# Patient Record
Sex: Male | Born: 1985 | Race: White | Hispanic: No | Marital: Single | Smoking: Never smoker
Health system: Southern US, Community
[De-identification: ages and names within clinical notes are randomized; demographics above are authoritative.]

---
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
## 2008-01-07 IMAGING — CR DG TIBIA/FIBULA PORT 2V*R*
4 series · 4 of 4 positions shown · non-contrast
Comparison: none

CLINICAL DATA: Fractured tibia and fibula with intramedullary rod fixation. 
 PORTABLE TIBIA/FIBULA - 4 VIEW:

[view not recorded (1 of 4)]
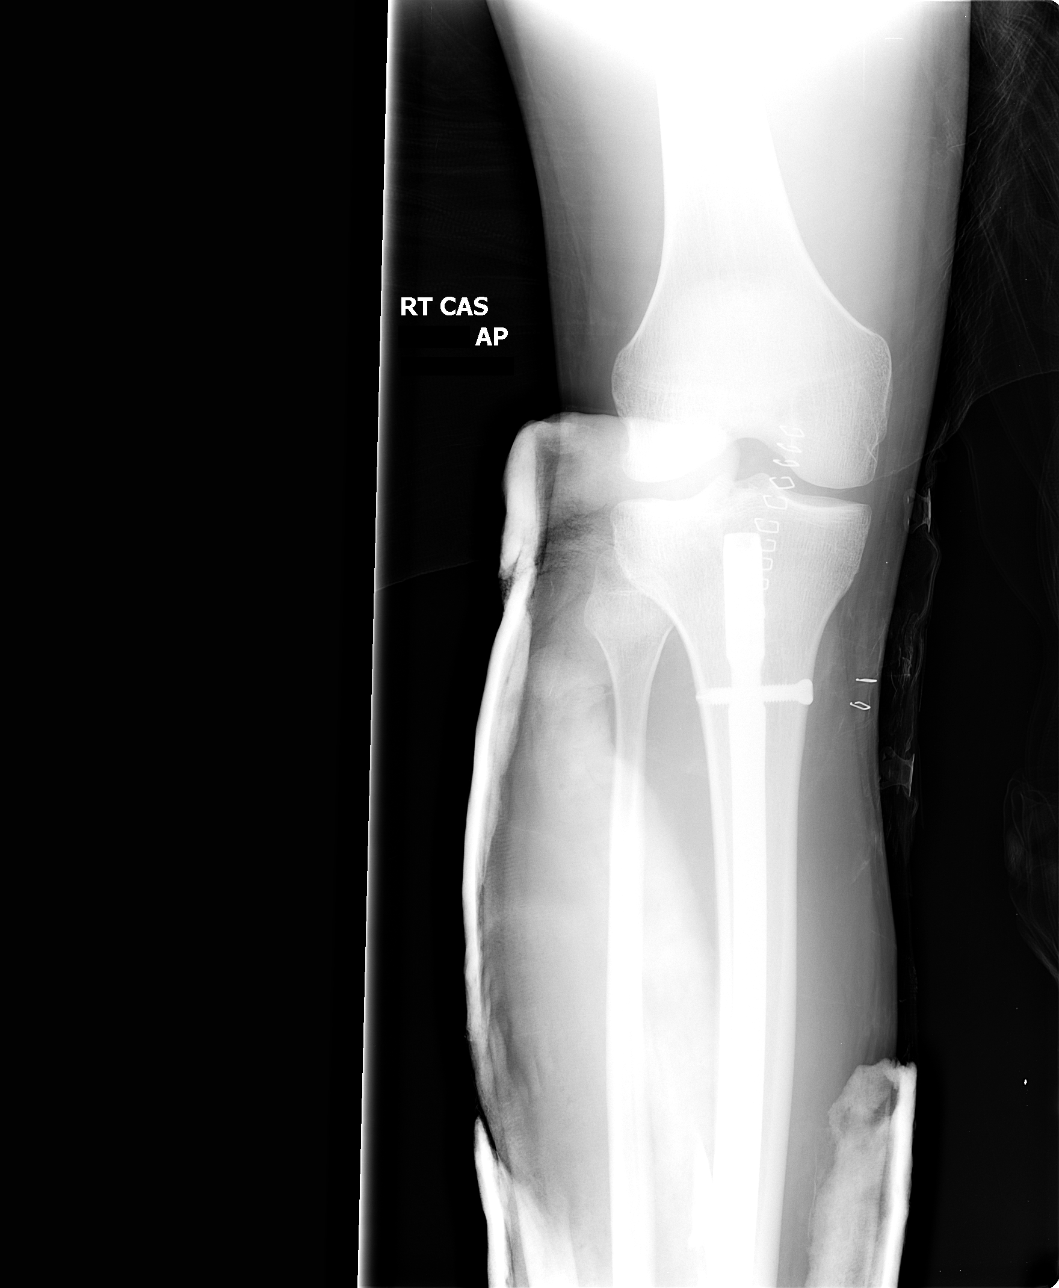

[view not recorded (2 of 4)]
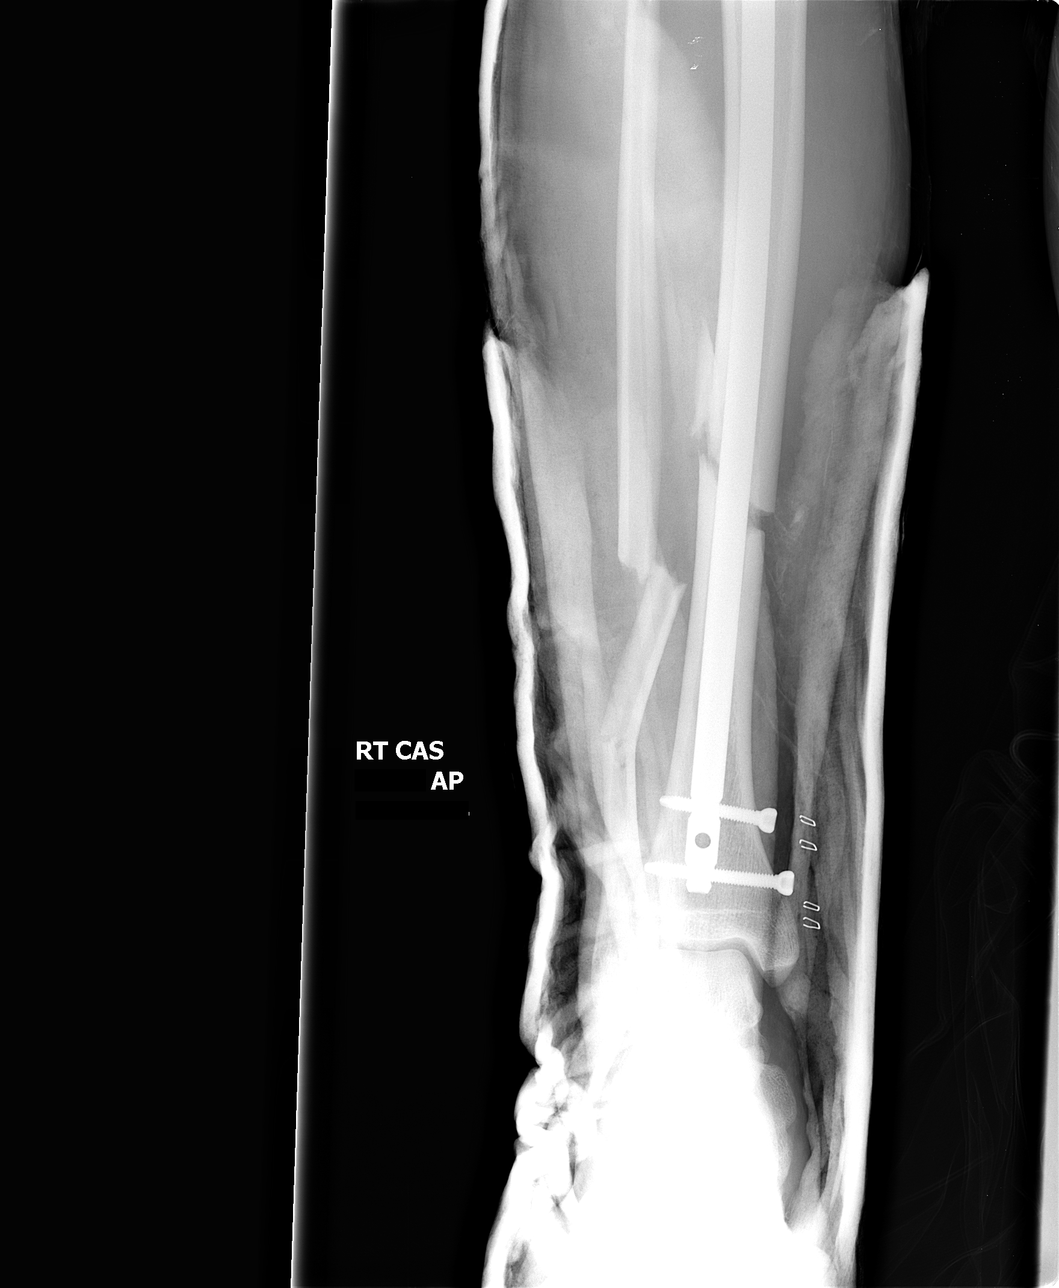

[view not recorded (3 of 4)]
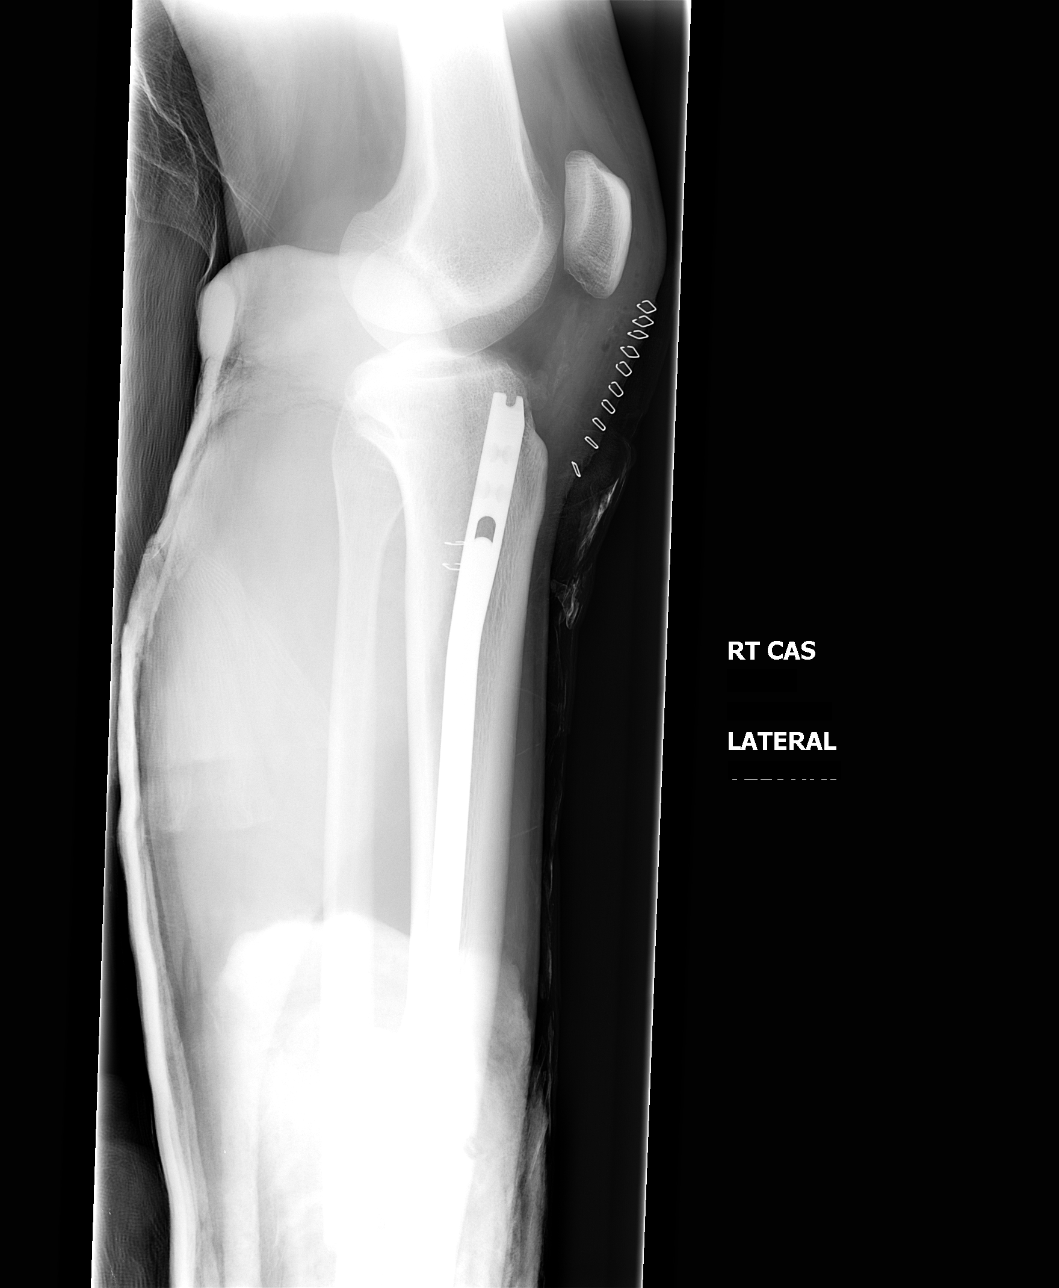

[view not recorded (4 of 4)]
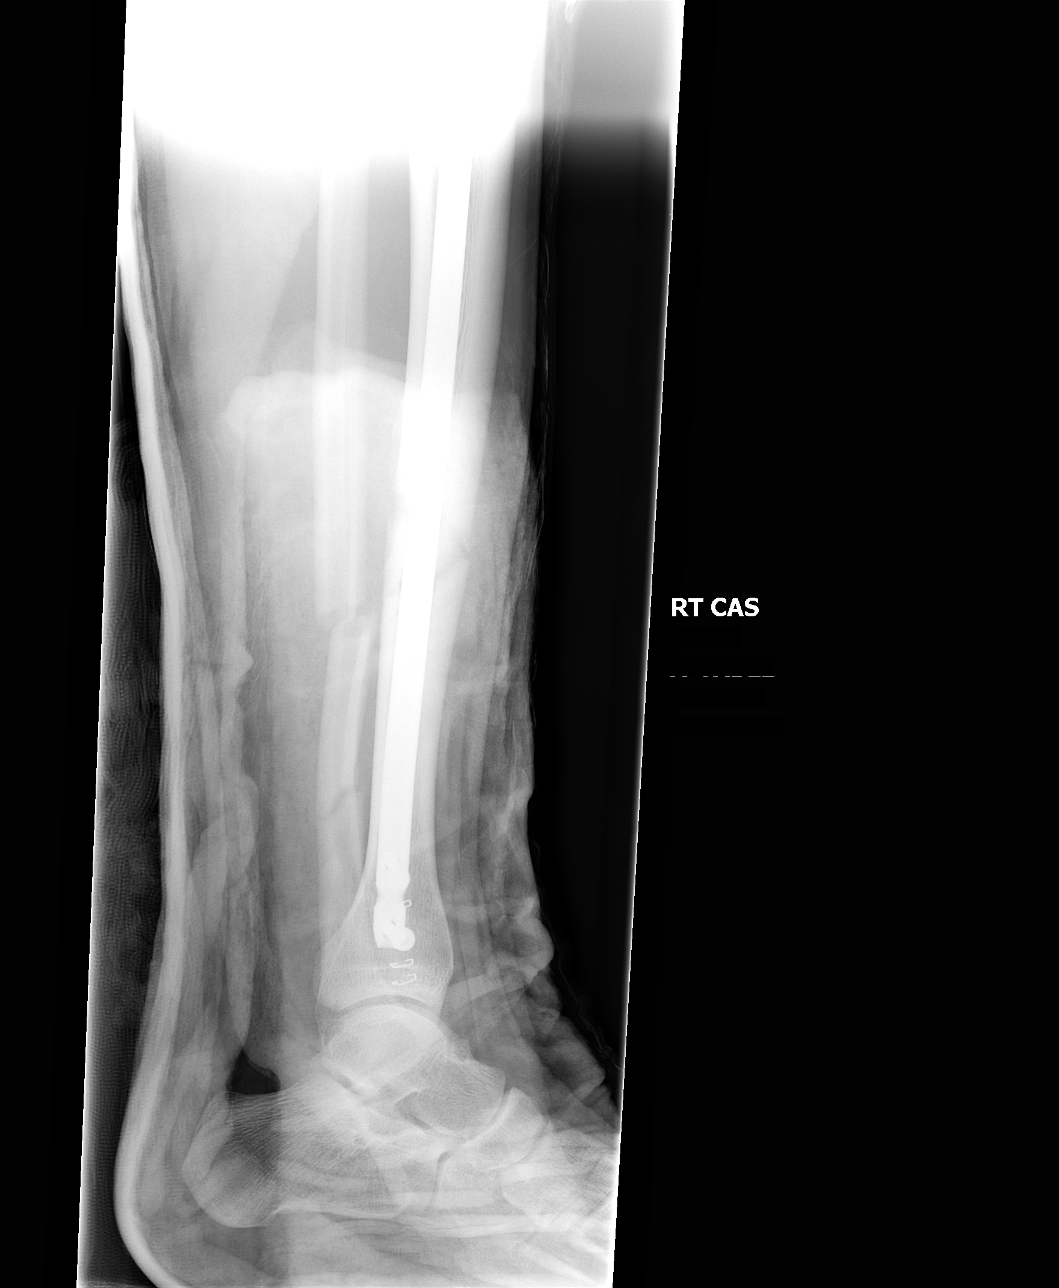

[4 of 4 positions shown; findings below may reference images not displayed]

FINDINGS: Four views of the right lower leg demonstrate intramedullary rod fixation of the tibia with a single proximal interlocking screw and two distal interlocking screws.  Redemonstration of the comminuted tibial fracture as well as the segmental fibular fracture.  The displacement of the fibula fracture has not significantly changed.  The leg has been placed within a splint.  Surgical staples along the anterior aspect of the knee.  The knee and ankle joints are intact.
IMPRESSION: Internal fixation of the comminuted tibial fracture as described.  No complicating features.
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
## 2010-11-29 LAB — CBC
MCHC: 34.3
MCV: 88.7
Platelets: 273
RDW: 12.6

## 2010-11-29 LAB — DIFFERENTIAL
Basophils Absolute: 0
Basophils Relative: 0
Eosinophils Absolute: 0
Neutro Abs: 14.5 — ABNORMAL HIGH
Neutrophils Relative %: 90 — ABNORMAL HIGH

## 2010-11-29 LAB — APTT: aPTT: 22 — ABNORMAL LOW

## 2010-11-29 LAB — PROTIME-INR: INR: 1.1

## 2018-08-01 ENCOUNTER — Ambulatory Visit (INDEPENDENT_AMBULATORY_CARE_PROVIDER_SITE_OTHER): Payer: Self-pay | Admitting: Family Medicine

## 2018-08-01 ENCOUNTER — Other Ambulatory Visit: Payer: Self-pay

## 2018-08-01 ENCOUNTER — Encounter: Payer: Self-pay | Admitting: Family Medicine

## 2018-08-01 DIAGNOSIS — M79675 Pain in left toe(s): Secondary | ICD-10-CM

## 2018-08-01 MED ORDER — DOXYCYCLINE HYCLATE 100 MG PO CAPS: 100.0000 mg | ORAL_CAPSULE | Freq: Two times a day (BID) | ORAL | 0 refills | Status: AC

## 2018-08-01 NOTE — Progress Notes (Signed)
   Office Visit Note   Patient: Matthew Mcconnell           Date of Birth: 03/04/1985           MRN: [id number] Visit Date: 08/01/2018 Requested by: No referring provider defined for this encounter. PCP: Patient, No Pcp Per  Subjective: Chief Complaint  Patient presents with  . Left Great Toe - Pain    Pain x approximately 1 month. 3 weeks ago, toe got red and had drainage around nailbed. Went to urgent care - was given mupirocin ointment. Using this and soaking in epsom salts. Toenail has stopped growing.    HPI: He is here with left great toe pain.  About 3 or 4 weeks ago he developed pain and redness in the great toe near the nail.  It started to drain and he went to urgent care and was given Bactroban.  Symptoms improved but did not go away completely and now is starting to get more uncomfortable again.  No fevers, he otherwise feels okay.  He has not had any drainage recently.  Incidentally his mother used to work in our clinic about 25 years ago.              ROS:   All other systems were reviewed and are negative.  Objective: Vital Signs: There were no vitals taken for this visit.  Physical Exam:  General:  Alert and oriented, in no acute distress. Pulm:  Breathing unlabored. Psy:  Normal mood, congruent affect. Skin: There is slight erythema but but no warmth. Left great toe: Minimal tenderness to palpation, no drainage expressible.  Good range of motion of the joint, no ingrown toenail.  Imaging: None today.  Assessment & Plan: 1.  Left great toe paronychia -We will treat with doxycycline by mouth.  If symptoms recur, then possible incision and drainage.  Follow-up as needed.     Procedures: No procedures performed  No notes on file     PMFS History: There are no active problems to display for this patient.  History reviewed. No pertinent past medical history.  History reviewed. No pertinent family history.  History reviewed. No pertinent surgical history.  Social History   Occupational History  . Not on file  Tobacco Use  . Smoking status: Never Smoker  . Smokeless tobacco: Never Used  Substance and Sexual Activity  . Alcohol use: Never    Frequency: Never  . Drug use: Never  . Sexual activity: Not on file

## 2018-08-08 ENCOUNTER — Telehealth: Payer: Self-pay | Admitting: Family Medicine

## 2018-08-08 NOTE — Telephone Encounter (Signed)
Patient called advised his left foot (Great Toe) is still bothering him. Patient said the toe still looks the same as last week. The number to contact patient is [phone]

## 2018-08-08 NOTE — Telephone Encounter (Signed)
The toe is looking red and angry again, with some drainage. He has 2 more days of Doxycycline left. Has been soaking the toe in epsom salts. Supposed to go on vacation in 2 weeks and would like to have the toe issue better by then. Scheduled appointment for tomorrow morning for a recheck.

## 2018-08-09 ENCOUNTER — Encounter: Payer: Self-pay | Admitting: Family Medicine

## 2018-08-09 ENCOUNTER — Other Ambulatory Visit: Payer: Self-pay

## 2018-08-09 ENCOUNTER — Ambulatory Visit (INDEPENDENT_AMBULATORY_CARE_PROVIDER_SITE_OTHER): Payer: BC Managed Care – PPO | Admitting: Family Medicine

## 2018-08-09 DIAGNOSIS — M79675 Pain in left toe(s): Secondary | ICD-10-CM | POA: Diagnosis not present

## 2018-08-09 MED ORDER — SULFAMETHOXAZOLE-TRIMETHOPRIM 800-160 MG PO TABS: 1.0000 | ORAL_TABLET | Freq: Two times a day (BID) | ORAL | 1 refills | Status: AC

## 2018-08-09 NOTE — Progress Notes (Signed)
   Office Visit Note   Patient: Matthew Mcconnell           Date of Birth: 1985-06-28           MRN: [id number] Visit Date: 08/09/2018 Requested by: No referring provider defined for this encounter. PCP: Patient, No Pcp Per  Subjective: Chief Complaint  Patient presents with  . Left Great Toe - Follow-up    "not much change"    HPI: He is here for follow-up left great toe paronychia.  Doxycycline initially seem to be helping, but now it does not seem to be getting any better.  He still gets some drainage, mostly clear yellow.  No fevers or chills.  He is going to the beach on vacation in about a week.              ROS:   All other systems were reviewed and are negative.  Objective: Vital Signs: There were no vitals taken for this visit.  Physical Exam:  General:  Alert and oriented, in no acute distress. Pulm:  Breathing unlabored. Psy:  Normal mood, congruent affect.  Left great toe: There is still some erythema and swelling of the paronychia.  It scant amount of clear yellow drainage.  No ingrown toenail.  Imaging: None today.  Assessment & Plan: 1.  Left great toe paronychia -Discussed with patient and elected to perform incision and drainage today.  We will switch to Bactrim.  Follow-up as needed.     Procedures: Left great toe paronychia incision and drainage: After sterile prep with Betadine, performed a digital block with a total of 4 cc 1% lidocaine without epinephrine.  Then using a 15 blade I made a small incision but there was no pus expressible.  Band-Aid was applied.  Follow-up as directed.    PMFS History: There are no active problems to display for this patient.  History reviewed. No pertinent past medical history.  History reviewed. No pertinent family history.  History reviewed. No pertinent surgical history. Social History   Occupational History  . Not on file  Tobacco Use  . Smoking status: Never Smoker  . Smokeless tobacco: Never Used  Substance  and Sexual Activity  . Alcohol use: Never    Frequency: Never  . Drug use: Never  . Sexual activity: Not on file

## 2018-08-14 ENCOUNTER — Telehealth: Payer: Self-pay

## 2018-08-14 MED ORDER — TRIAMCINOLONE ACETONIDE 0.5 % EX OINT: 1.0000 "application " | TOPICAL_OINTMENT | Freq: Two times a day (BID) | CUTANEOUS | 1 refills | Status: AC | PRN

## 2018-08-14 NOTE — Telephone Encounter (Signed)
I advised patient of the plan. Dr. Jess Barters first available appointment is on 08/19/18. The patient is going out of town on 08/18/18. He said the toe did look some better 2 days ago, but looks the same as it did at last Friday's visit again. He is just worried about it worsening while he is on vacation. Does he go ahead and refill the Bactrim once he finishes the first Rx? Any other advice I should give him?

## 2018-08-14 NOTE — Addendum Note (Signed)
Addended by: Hortencia Pilar on: 08/14/2018 03:29 PM   Modules accepted: Orders

## 2018-08-14 NOTE — Telephone Encounter (Signed)
Let's have him come in to see Sharol Given for another opinion.

## 2018-08-14 NOTE — Telephone Encounter (Signed)
Patient called stating that his left great toe is not getting any better and would like to know what the next option would be?  Asked patient if he wanted to make an appointment to come in to be seen, but he stated that he would prefer a call back first. Cb# is [phone].  Please advise.  Thank You.

## 2018-08-14 NOTE — Telephone Encounter (Signed)
Please advise 

## 2018-08-14 NOTE — Telephone Encounter (Signed)
At this point I think it's more of an inflammation problem than an infection problem.  I will call in a steroid ointment to apply to the area twice daily.  No need to fill the antibiotic again.

## 2018-08-14 NOTE — Telephone Encounter (Signed)
I advised the patient. He will finish this round of antibiotics and start using the triamcinolone ointment bid prn. He said if the toe is still not looking any different the middle of next week, he would like to see Dr. Sharol Given for that 2nd opinion - ok to call us to schedule this if needed.

## 2018-08-15 ENCOUNTER — Other Ambulatory Visit: Payer: Self-pay | Admitting: Family Medicine

## 2018-09-03 ENCOUNTER — Encounter: Payer: Self-pay | Admitting: Orthopedic Surgery

## 2018-09-03 ENCOUNTER — Ambulatory Visit: Payer: BC Managed Care – PPO

## 2018-09-03 ENCOUNTER — Ambulatory Visit (INDEPENDENT_AMBULATORY_CARE_PROVIDER_SITE_OTHER): Payer: BC Managed Care – PPO | Admitting: Orthopedic Surgery

## 2018-09-03 DIAGNOSIS — L03032 Cellulitis of left toe: Secondary | ICD-10-CM | POA: Diagnosis not present

## 2018-09-03 DIAGNOSIS — M79675 Pain in left toe(s): Secondary | ICD-10-CM

## 2018-09-03 NOTE — Progress Notes (Signed)
Office Visit Note   Patient: Matthew Mcconnell           Date of Birth: 1986/01/30           MRN: [id number] Visit Date: 09/03/2018              Requested by: No referring provider defined for this encounter. PCP: Patient, No Pcp Per  Chief Complaint  Patient presents with  . Left Foot - Follow-up    NP; left foot GT pain ref by Matthew Mcconnell      HPI: Patient is a 33 year old gentleman who presents with a two-month history of infected purulent paronychial infection left great toe.  Patient states he was initially seen at urgent care and was started on an antibiotic ointment.  Patient then saw Matthew. Junius Mcconnell was started on doxycycline and this was changed to Bactrim and then patient states he was placed on a steroid cream.  Patient complains of persistent pain swelling and redness over the proximal nail.  Assessment & Plan: Visit Diagnoses:  1. Great toe pain, left   2. Paronychia of great toe of left foot     Plan: The nail was removed without complications cultures were obtained he will start dressing changes tomorrow reevaluate in 1 week we will call him if the cultures are positive.  Follow-Up Instructions: Return in about 1 week (around 09/10/2018).   Ortho Exam  Patient is alert, oriented, no adenopathy, well-dressed, normal affect, normal respiratory effort. Examination patient has a good pulse he has redness and tenderness to palpation along the nail fold consistent with a paronychial infection.  After informed consent patient underwent a digital block after prepping with Betadine the nail was removed without complications a cultures was obtained from the germinal matrix fluid.  This was packed open with Xeroform and a sterile dressing was applied.  Patient tolerated this well he was placed in a postoperative shoe.  Imaging: Xr Toe Great Left  Result Date: 09/03/2018 2 view radiographs of the left great toe shows no destructive bony changes but there is one area of lucency.  No  images are attached to the encounter.  Labs: No results found for: HGBA1C, ESRSEDRATE, CRP, LABURIC, REPTSTATUS, GRAMSTAIN, CULT, LABORGA   No results found for: ALBUMIN, PREALBUMIN, LABURIC  No results found for: MG No results found for: VD25OH  No results found for: PREALBUMIN CBC EXTENDED 08/25/2006  WBC 16.1(H)  RBC 4.51  HGB 13.7  HCT 40.0  PLT 273  NEUTROABS 14.5(H)  LYMPHSABS 0.6(L)     There is no height or weight on file to calculate BMI.  Orders:  Orders Placed This Encounter  Procedures  . Wound culture  . XR Toe Great Left   No orders of the defined types were placed in this encounter.    Procedures: No procedures performed  Clinical Data: No additional findings.  ROS:  All other systems negative, except as noted in the HPI. Review of Systems  Objective: Vital Signs: There were no vitals taken for this visit.  Specialty Comments:  No specialty comments available.  PMFS History: There are no active problems to display for this patient.  History reviewed. No pertinent past medical history.  History reviewed. No pertinent family history.  History reviewed. No pertinent surgical history. Social History   Occupational History  . Not on file  Tobacco Use  . Smoking status: Never Smoker  . Smokeless tobacco: Never Used  Substance and Sexual Activity  . Alcohol use: Never  Frequency: Never  . Drug use: Never  . Sexual activity: Not on file

## 2018-09-06 LAB — WOUND CULTURE
MICRO NUMBER:: 690182
SPECIMEN QUALITY:: ADEQUATE

## 2018-09-09 ENCOUNTER — Other Ambulatory Visit: Payer: Self-pay

## 2018-09-09 ENCOUNTER — Ambulatory Visit (INDEPENDENT_AMBULATORY_CARE_PROVIDER_SITE_OTHER): Payer: BC Managed Care – PPO | Admitting: Orthopedic Surgery

## 2018-09-09 ENCOUNTER — Encounter: Payer: Self-pay | Admitting: Orthopedic Surgery

## 2018-09-09 DIAGNOSIS — L03032 Cellulitis of left toe: Secondary | ICD-10-CM

## 2018-09-09 NOTE — Progress Notes (Signed)
   Office Visit Note   Patient: Matthew Mcconnell           Date of Birth: 01-01-1986           MRN: [id number] Visit Date: 09/09/2018              Requested by: No referring provider defined for this encounter. PCP: Patient, No Pcp Per  Chief Complaint  Patient presents with  . Left Foot - Follow-up    GT nail removal 09/04/18      HPI: Patient is a 33 year old gentleman who presents follow-up status post great toenail excision for paronychial infection.  Cultures showed gram-positive cocci as well as yeast which was felt to be consistent with normal skin flora.  Patient states his foot feels much better at this time he states the swelling has resolved.  Assessment & Plan: Visit Diagnoses:  1. Paronychia of great toe of left foot     Plan: Patient will continue with a Band-Aid to protect the nailbed follow-up as needed if he develops any recurrent redness swelling cellulitis or drainage.  Follow-Up Instructions: Return if symptoms worsen or fail to improve.   Ortho Exam  Patient is alert, oriented, no adenopathy, well-dressed, normal affect, normal respiratory effort. Examination the redness and swelling of the great toe of completely resolved.  There is no tenderness to palpation no drainage no signs of infection.  Imaging: No results found. No images are attached to the encounter.  Labs: No results found for: HGBA1C, ESRSEDRATE, CRP, LABURIC, REPTSTATUS, GRAMSTAIN, CULT, LABORGA   No results found for: ALBUMIN, PREALBUMIN, LABURIC  No results found for: MG No results found for: VD25OH  No results found for: PREALBUMIN CBC EXTENDED 08/25/2006  WBC 16.1(H)  RBC 4.51  HGB 13.7  HCT 40.0  PLT 273  NEUTROABS 14.5(H)  LYMPHSABS 0.6(L)     There is no height or weight on file to calculate BMI.  Orders:  No orders of the defined types were placed in this encounter.  No orders of the defined types were placed in this encounter.    Procedures: No procedures  performed  Clinical Data: No additional findings.  ROS:  All other systems negative, except as noted in the HPI. Review of Systems  Objective: Vital Signs: There were no vitals taken for this visit.  Specialty Comments:  No specialty comments available.  PMFS History: There are no active problems to display for this patient.  History reviewed. No pertinent past medical history.  History reviewed. No pertinent family history.  History reviewed. No pertinent surgical history. Social History   Occupational History  . Not on file  Tobacco Use  . Smoking status: Never Smoker  . Smokeless tobacco: Never Used  Substance and Sexual Activity  . Alcohol use: Never    Frequency: Never  . Drug use: Never  . Sexual activity: Not on file

## 2018-09-10 ENCOUNTER — Ambulatory Visit: Payer: BC Managed Care – PPO | Admitting: Orthopedic Surgery

## 2020-12-31 ENCOUNTER — Ambulatory Visit: Payer: Self-pay

## 2020-12-31 ENCOUNTER — Encounter: Payer: Self-pay | Admitting: Orthopedic Surgery

## 2020-12-31 ENCOUNTER — Ambulatory Visit (INDEPENDENT_AMBULATORY_CARE_PROVIDER_SITE_OTHER): Payer: BC Managed Care – PPO | Admitting: Orthopedic Surgery

## 2020-12-31 ENCOUNTER — Other Ambulatory Visit: Payer: Self-pay

## 2020-12-31 VITALS — BP 130/80 | HR 66 | Ht 72.0 in | Wt 180.0 lb

## 2020-12-31 DIAGNOSIS — M79641 Pain in right hand: Secondary | ICD-10-CM | POA: Diagnosis not present
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
## 2021-01-03 ENCOUNTER — Ambulatory Visit: Payer: BC Managed Care – PPO | Admitting: Physician Assistant

## 2021-01-25 ENCOUNTER — Encounter: Payer: Self-pay | Admitting: Orthopedic Surgery

## 2021-01-25 ENCOUNTER — Ambulatory Visit: Payer: Self-pay

## 2021-01-25 ENCOUNTER — Ambulatory Visit (INDEPENDENT_AMBULATORY_CARE_PROVIDER_SITE_OTHER): Payer: BC Managed Care – PPO | Admitting: Orthopedic Surgery

## 2021-01-25 ENCOUNTER — Other Ambulatory Visit: Payer: Self-pay

## 2021-01-25 VITALS — BP 132/87 | HR 87

## 2021-01-25 DIAGNOSIS — M79641 Pain in right hand: Secondary | ICD-10-CM

## 2021-01-25 DIAGNOSIS — M25521 Pain in right elbow: Secondary | ICD-10-CM
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
## 2021-03-25 ENCOUNTER — Ambulatory Visit (INDEPENDENT_AMBULATORY_CARE_PROVIDER_SITE_OTHER): Payer: BC Managed Care – PPO | Admitting: Orthopedic Surgery

## 2021-03-25 ENCOUNTER — Other Ambulatory Visit: Payer: Self-pay

## 2021-03-25 ENCOUNTER — Encounter: Payer: Self-pay | Admitting: Orthopedic Surgery

## 2021-03-25 ENCOUNTER — Ambulatory Visit: Payer: Self-pay

## 2021-03-25 DIAGNOSIS — M79641 Pain in right hand: Secondary | ICD-10-CM
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
